# Patient Record
Sex: Male | Born: 1975
Health system: Southern US, Community
[De-identification: ages and names within clinical notes are randomized; demographics above are authoritative.]

---

## 2003-07-06 ENCOUNTER — Emergency Department (HOSPITAL_COMMUNITY): Admission: EM | Admit: 2003-07-06 | Discharge: 2003-07-06 | Payer: Self-pay | Admitting: Emergency Medicine

## 2006-03-12 ENCOUNTER — Encounter: Admission: RE | Admit: 2006-03-12 | Discharge: 2006-03-12 | Payer: Self-pay | Admitting: Neurosurgery

## 2006-04-03 ENCOUNTER — Ambulatory Visit (HOSPITAL_COMMUNITY): Admission: RE | Admit: 2006-04-03 | Discharge: 2006-04-04 | Payer: Self-pay | Admitting: Neurosurgery

## 2006-08-30 IMAGING — CR DG LUMBAR SPINE 1V
1 series · 1 of 1 positions shown · non-contrast
Comparison: Lumbar spine MRI done at [HOSPITAL] on 03/12/06.

CLINICAL DATA: Redo L5-S1 diskectomy.
 PORTABLE LUMBAR SPINE ? 1 VIEW ? 04/03/06:

[view not recorded]
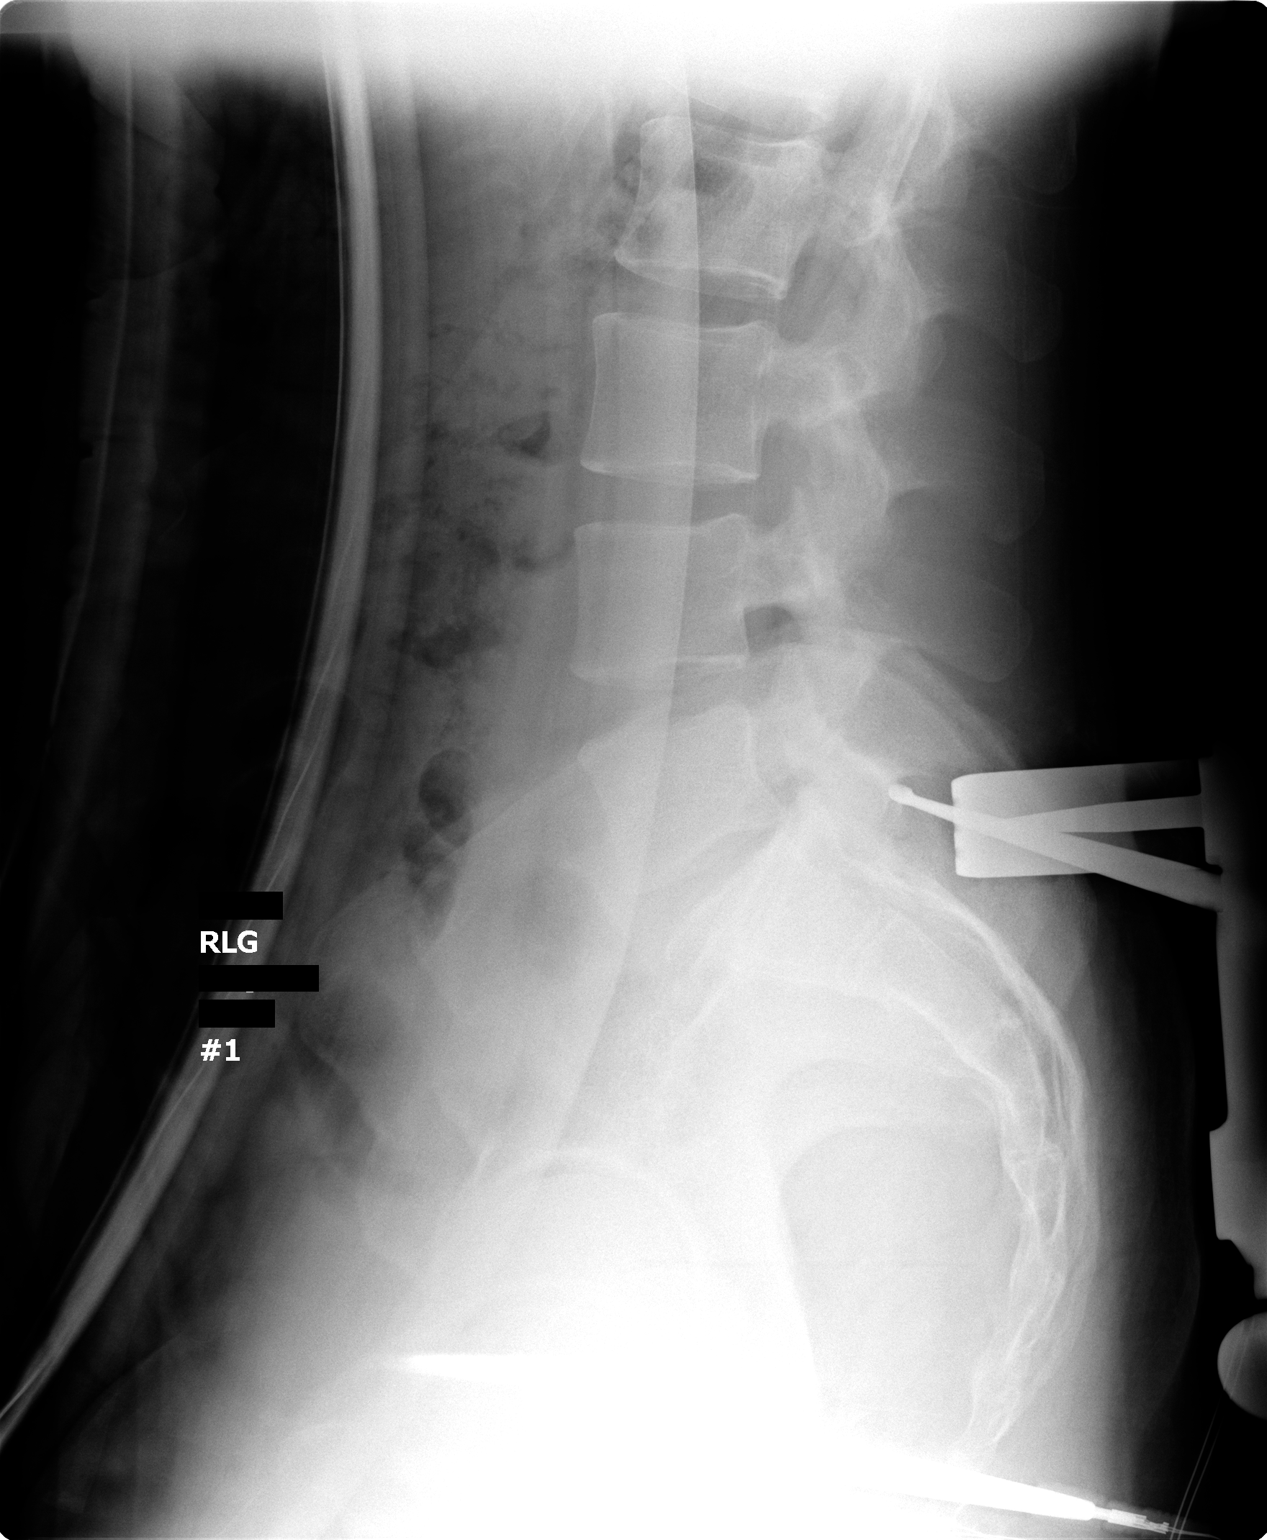

[1 of 1 positions shown; findings below may reference images not displayed]

FINDINGS: Intraoperative lateral view of the lumbar spine demonstrates retractors and a surgical probe posterior to the L5-S1 disk space.
IMPRESSION: Intraoperative localization as described.

## 2012-07-07 ENCOUNTER — Ambulatory Visit (HOSPITAL_BASED_OUTPATIENT_CLINIC_OR_DEPARTMENT_OTHER)
Admission: RE | Admit: 2012-07-07 | Discharge: 2012-07-07 | Disposition: A | Discharge status: Home / Self Care | Payer: Self-pay | Source: Ambulatory Visit | Attending: Occupational Medicine | Admitting: Occupational Medicine

## 2012-07-07 ENCOUNTER — Other Ambulatory Visit: Payer: Self-pay | Admitting: Occupational Medicine

## 2012-07-07 DIAGNOSIS — Z0000 Encounter for general adult medical examination without abnormal findings: Secondary | ICD-10-CM

## 2012-12-03 IMAGING — CR DG CHEST 1V
1 series · 1 of 1 positions shown · non-contrast
Comparison: None.

CLINICAL DATA: Annual physical examination.

CHEST - 1 VIEW

[w chest pa]
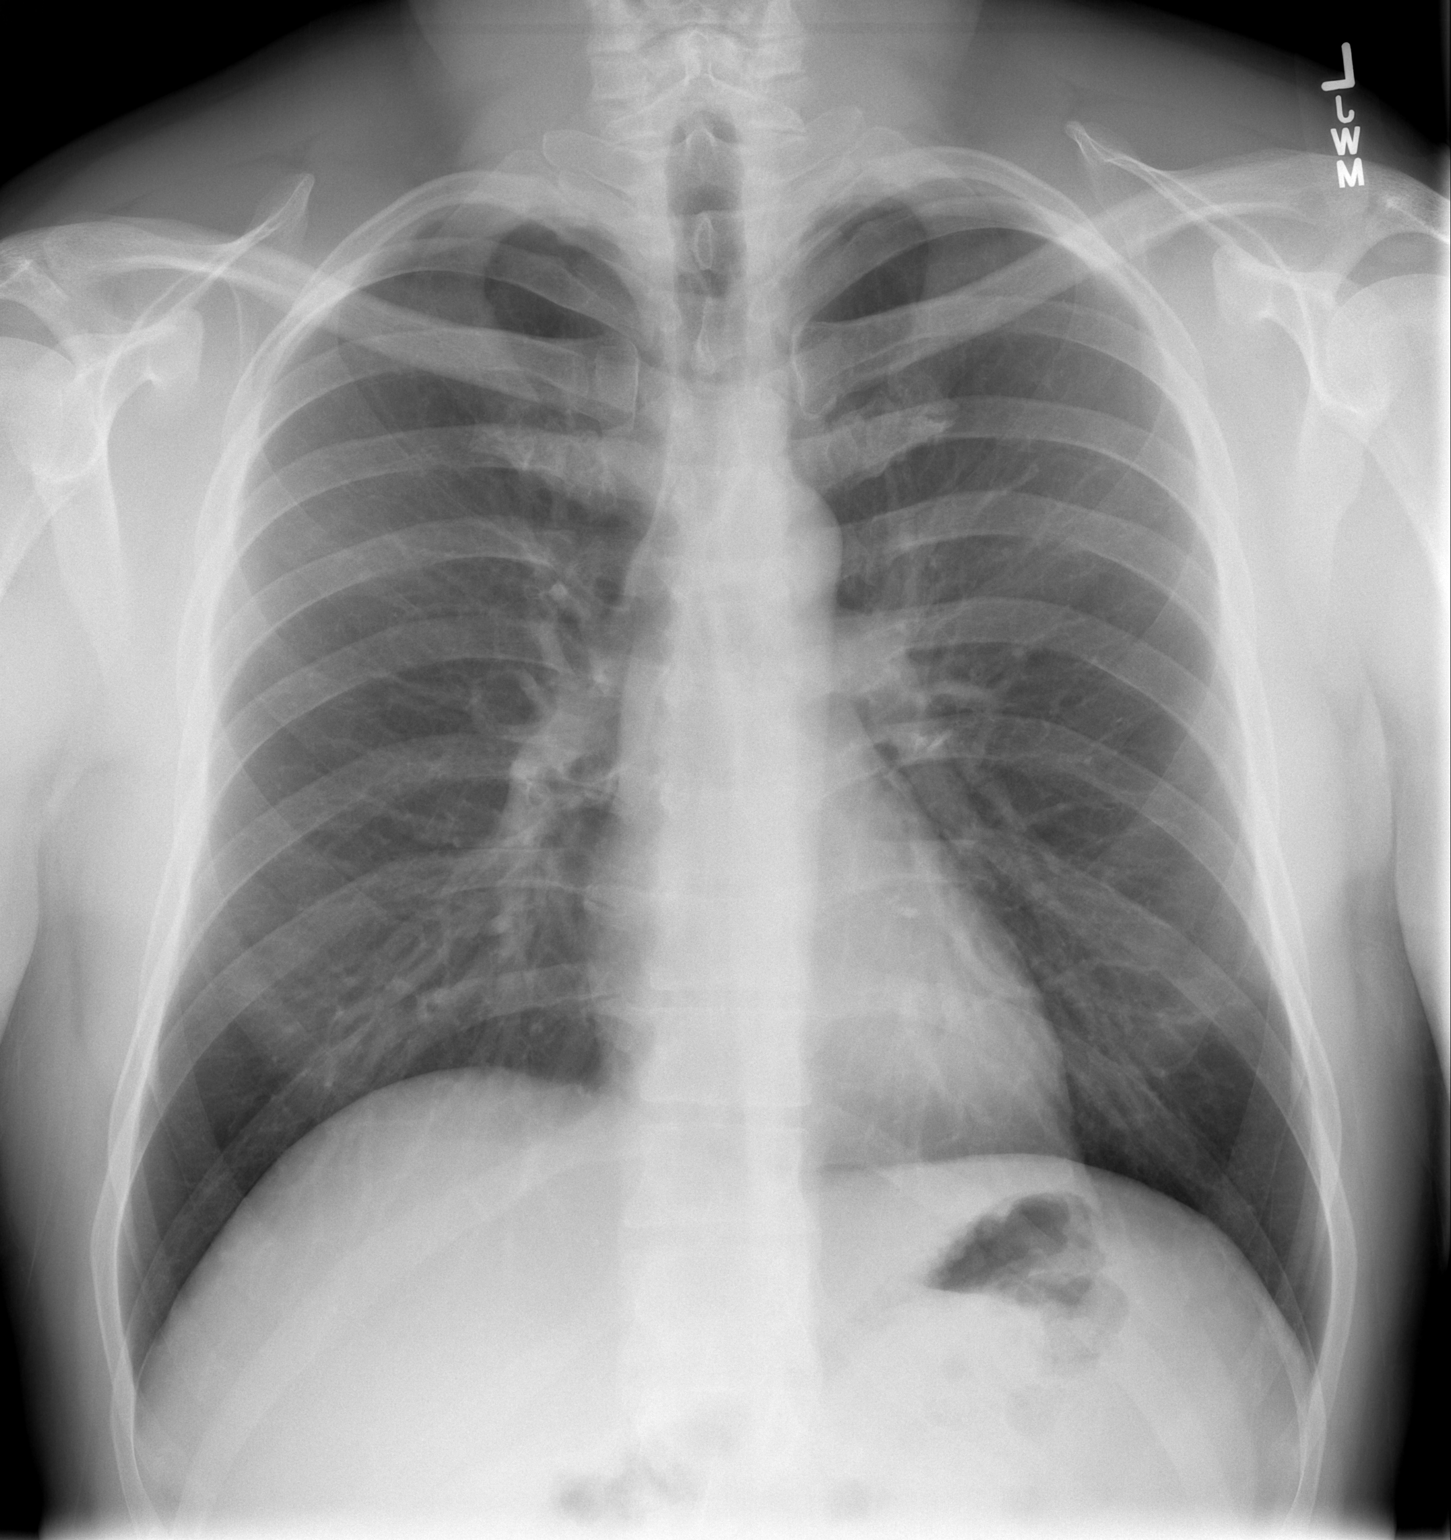

[1 of 1 positions shown; findings below may reference images not displayed]

FINDINGS: The heart size and mediastinal contours are normal. The
lungs are clear. There is no pleural effusion or pneumothorax. No
acute osseous findings are identified.
IMPRESSION: No active cardiopulmonary process.

## 2016-08-13 ENCOUNTER — Ambulatory Visit: Payer: Self-pay | Admitting: Family Medicine

## 2016-12-26 ENCOUNTER — Encounter: Payer: Self-pay | Admitting: Sports Medicine

## 2016-12-26 ENCOUNTER — Ambulatory Visit (INDEPENDENT_AMBULATORY_CARE_PROVIDER_SITE_OTHER): Payer: Managed Care, Other (non HMO) | Admitting: Sports Medicine

## 2016-12-26 VITALS — BP 136/55 | HR 70 | Ht 73.0 in | Wt 200.0 lb

## 2016-12-26 DIAGNOSIS — M79652 Pain in left thigh: Secondary | ICD-10-CM

## 2016-12-26 DIAGNOSIS — M25552 Pain in left hip: Secondary | ICD-10-CM

## 2016-12-26 MED ORDER — GABAPENTIN 300 MG PO CAPS: 300.0000 mg | ORAL_CAPSULE | Freq: Every day | ORAL | 3 refills | Status: DC

## 2016-12-26 MED ORDER — TRAMADOL HCL 50 MG PO TABS: 50.0000 mg | ORAL_TABLET | Freq: Three times a day (TID) | ORAL | 1 refills | Status: DC | PRN

## 2016-12-26 MED FILL — traMADol HCL 50 MG TABS: 50 | 30 days supply | Qty: 90 | Fill #0

## 2016-12-26 MED FILL — GABAPENTIN 300 MG CAPSULE: 300 | 30 days supply | Qty: 30 | Fill #0

## 2016-12-26 NOTE — Progress Notes (Signed)
Subjective:    Alexander Hopkins is a 41 y.o. 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 He had another incident of disc rupture in 2007 and had discectomy at the same level.   Now comes in with left thigh pain posteriorly for almost a year. Describes the pain as dull and stiff in his upper thigh posteriorly. No radiation to his lower leg or foot. Denies numbness or weakness. However, he reports left leg unsteadiness when he does jogging and works of on row machine. He also reports stiffness in that leg at the end of the day when he lies on that side. Denies fever, urinary or bowel issues or saddle paresthesia. He reports trying tramadol in the past with some improvement in his pain. PMH/Problem List: has Left thigh pain on his problem list.   has no past medical history on file.  FH:  No family history on file.  SH Social History  Substance Use Topics  . Smoking status: Never Smoker  . Smokeless tobacco: Never Used  . Alcohol use Not on file    Review of Systems Review of systems negative except for pertinent positives and negatives in history of present illness above.     Objective:     Vitals:   12/26/16 1018  BP: (!) 136/55  Pulse: 70  Weight: 200 lb (90.7 kg)  Height: 6\' 1"  (1.854 m)    Physical Exam GEN: appears well and fit, no apparent distress. MSK:   Hip and Left Lower Extremity No bony deformities, inflammation or skin lesion, or tenderness in hip joint. Normal ROM upon flexion & extension, internal & external rotation, abduction, & adduction. FABER and FADIR normal. Notable asymmetry in his lower calf with walking, with left calf smaller that right. Left leg instability noted with jogging in the hall way. Right calf circumference 38 cm. Left call circumference 36.5 cm at maximum point. Weakness with  abduction with gluteus medius isolated in the left. No significant weakness noted with recruitment of gluteus maximus and iliotibial muscle. Weakness of left hamstring muscle with resisted flexion in his left knee. SKIN: no apparent skin lesion PSYCH: euthymic mood with congruent affect    Assessment and Plan:  Left thigh pain Likely due to nerve inhibition from his prior disc herniations. Exam remarkable for assymmetry in his calf with right calf bigger than left and unsteadiness in his left leg while jogging and walking. Significant weakness with left leg abduction with isolation of gluteus medius. Recommended strengthening exercises as below 1. Left leg mini squat three sets of 15 2. Wall squat (left leg squat with back against the wall) three sets of 15 3. Hip abduction: 3 sets of 30 without weight 4. Lateral step up with 60 lbs of weight on your shoulder 5. Cross over step ups 6. Hamstring exercise with some weight on your ankle (4 sets of 8) 7. Diver exercise  Gave prescription for gabapentin 300 mg at bedtime as needed for pain with 2 refills and tramadol 50 mg 3 times a day as needed for pain with one refill. Patient to return in 3 months or sooner if needed.   Return in about 2 months (around 02/25/2017) for Hamstring pain.  Almon Hercules, MD   I observed and examined the patient with the resident and  agree with assessment and plan.  Note reviewed and modified by me.  Enid BaasKarl Fields, MD  12/26/16

## 2016-12-26 NOTE — Assessment & Plan Note (Addendum)
Likely due to nerve inhibition from his prior disc herniations. Exam remarkable for assymmetry in his calf with right calf bigger than left and unsteadiness in his left leg while jogging and walking. Significant weakness with left leg abduction with isolation of gluteus medius. Recommended strengthening exercises as below 1. Left leg mini squat three sets of 15 2. Wall squat (left leg squat with back against the wall) three sets of 15 3. Hip abduction: 3 sets of 30 without weight 4. Lateral step up with 60 lbs of weight on your shoulder 5. Cross over step ups 6. Hamstring exercise with some weight on your ankle (4 sets of 8) 7. Diver exercise  Gave prescription for gabapentin 300 mg at bedtime as needed for pain with 2 refills and tramadol 50 mg 3 times a day as needed for pain with one refill. Patient to return in 3 months or sooner if needed.

## 2016-12-26 NOTE — Patient Instructions (Addendum)
It is nice seeing you today! Your symptoms are likely due to some muscle weakness in your left leg. We recommned the following exercise:  1. Left leg mini squat three sets of 15 2. Wall squat (left leg squat with back against the wall) three sets of 15 3. Hip abduction: 3 sets of 30 without weight 4. Use the lateral step up with 60 lbs of weight on you shoulder 5. Cross over step up as on the handout we gave you 6. Hamstring exercise with some weight on your ankle (4 sets of 8) 7. Diver exercise  8. Try the medication we gave you at bedtime

## 2017-01-29 MED FILL — traMADol HCL 50 MG TABS: 50 | 30 days supply | Qty: 90 | Fill #1

## 2017-02-25 ENCOUNTER — Other Ambulatory Visit: Payer: Self-pay | Admitting: Student

## 2017-02-25 DIAGNOSIS — M25552 Pain in left hip: Secondary | ICD-10-CM

## 2017-02-27 ENCOUNTER — Other Ambulatory Visit: Payer: Self-pay | Admitting: Student

## 2017-02-27 DIAGNOSIS — M25552 Pain in left hip: Secondary | ICD-10-CM

## 2017-03-05 ENCOUNTER — Ambulatory Visit (INDEPENDENT_AMBULATORY_CARE_PROVIDER_SITE_OTHER): Payer: Managed Care, Other (non HMO) | Admitting: Sports Medicine

## 2017-03-05 ENCOUNTER — Encounter: Payer: Self-pay | Admitting: Sports Medicine

## 2017-03-05 DIAGNOSIS — M79652 Pain in left thigh: Secondary | ICD-10-CM | POA: Diagnosis not present

## 2017-03-05 DIAGNOSIS — M5106 Intervertebral disc disorders with myelopathy, lumbar region: Secondary | ICD-10-CM | POA: Insufficient documentation

## 2017-03-05 MED ORDER — TRAMADOL HCL 50 MG PO TABS: 50.0000 mg | ORAL_TABLET | Freq: Three times a day (TID) | ORAL | 3 refills | Status: DC

## 2017-03-05 MED FILL — traMADol HCL 50 MG TABS: 50 | 30 days supply | Qty: 90 | Fill #0

## 2017-03-05 NOTE — Assessment & Plan Note (Signed)
This was radicular No response to gabapentin but good response to tramadol

## 2017-03-05 NOTE — Assessment & Plan Note (Signed)
Needs to continue back exercises  Cont to work on Hip strength  Use tramadol tid and some prn ibuprofen  RECK 4 mos

## 2017-03-05 NOTE — Progress Notes (Signed)
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 now Doing HEP for hip weakness Much less night pain Left leg feels more stable  Tramadol 50 TID controls pain with minimal radicular sxs and can sleep through night Gabapentin did not seem to help so he stopped this  ROS No numbness in foot or lower leg No cough or sneeze pain No radiating pain below knee  PE Muscular M in NAD BP (!) 134/97   Ht 6\' 1"  (1.854 m)   Wt 200 lb (90.7 kg)   BMI 26.39 kg/m   Left hip shows full ROM SLR is tight at 45 degrees left and 60 RT Hip abduction is now strong (was weak) 1 leg balance improved Hamstring testing on Left strong but causes cramping  Gait is improved with trendelenburg no longer present

## 2017-04-04 MED FILL — traMADol HCL 50 MG TABS: 50 | 30 days supply | Qty: 90 | Fill #1

## 2017-05-02 MED FILL — traMADol HCL 50 MG TABS: 50 | 30 days supply | Qty: 90 | Fill #2

## 2017-06-02 MED FILL — traMADol HCL 50 MG TABS: 50 | 30 days supply | Qty: 90 | Fill #3

## 2017-07-01 ENCOUNTER — Other Ambulatory Visit: Payer: Self-pay | Admitting: Sports Medicine

## 2017-07-02 MED FILL — traMADol HCL 50 MG TABS: 50 | 30 days supply | Qty: 90 | Fill #0

## 2017-07-15 ENCOUNTER — Ambulatory Visit (INDEPENDENT_AMBULATORY_CARE_PROVIDER_SITE_OTHER): Payer: Managed Care, Other (non HMO) | Admitting: Sports Medicine

## 2017-07-15 VITALS — BP 126/82 | Ht 73.0 in | Wt 190.0 lb

## 2017-07-15 DIAGNOSIS — M25562 Pain in left knee: Secondary | ICD-10-CM | POA: Diagnosis not present

## 2017-07-15 DIAGNOSIS — M76892 Other specified enthesopathies of left lower limb, excluding foot: Secondary | ICD-10-CM | POA: Diagnosis not present

## 2017-07-15 NOTE — Progress Notes (Signed)
Chief complaint: Follow-up low back and left hip pain, new complaint of left knee crepitus 2 weeks  History of present illness: Alexander Hopkins is a 41 year old male who presents to the sports medicine office today for follow-up of low back and left hip/thigh radiculopathy. He was last seen here in the office back on 03/05/17. At that time, he was doing strengthening exercises. He has been doing Nurse, mental health, has been doing box squats and lunges. He reports feeling that his hip strength has improved. Of note, he does have history of lumbar radiculopathy affecting the left side, had surgery back in 2007. Had a repeat injury back in 2015. Has had laminectomy. Not taking tramadol or gabapentin for pain. Overall, he is pleased with his progress. As not report of any numbness or tingling, does report a slight weakness and left lower extremity compared to the right lower extremity. Does not report of any anterior lateral hip pain on the left side.  Today, he would like to discuss left knee crepitus. He reports over the last 2 weeks noting left knee crunching. He reports first noticing this when doing a box squats 2 weeks ago. He reports specifically feeling this on the medial aspect of the left knee. He does not report of any warmth, erythema, ecchymosis, or effusion. He reports a very minimal pain, only 2/10. He reports of any pivoting or twisting as aggravating factor. No previous left knee injury. Has not used any medications for relief of symptoms. Once to make sure he doesn't do anything to worsen his symptoms.  Review of systems:  As stated above  Physical exam: Vital signs are reviewed and are documented in the chart Gen.: Alert, oriented, appears stated age, in no apparent distress HEENT: Moist oral mucosa Respiratory: Normal respirations, able to speak in full sentences Cardiac: Regular rate, distal pulses 2+ Integumentary: No rashes on visible skin:  Neurologic: Hip abductor strength improved, just minimally  weaker on the left side compared to right side, would actually categorize both as I/5, otherwise strength 5/5, sensation 2+ in bilateral lower extremities Psych: Normal affect, mood is described as good Musculoskeletal: Inspection of left knee reveals no obvious deformity or muscle atrophy, no warmth, erythema, ecchymosis, or effusion, no tenderness to palpation over patellar tendon, quadriceps tendon, medial joint line, lateral joint line, he does have full active and passive range of motion of the left knee, do hear audible crepitus with flexion and extension of the left knee, Mc[place]ray positive for crepitus, negative for pain, [place]ss[place] positive for crepitus, negative for pain  Limited musculoskeletal ultrasound was performed in the office today. He did ultrasound of the left knee was performed today. He does have slight suprapatellar effusion noted, but no hypoechoic changes seen along quadriceps tendon. He does have spurring noted on the distal quadriceps tendon. Patellar tendon normal, without any evidence of hypoechogenicity or spurring. Medial joint line reviewed, normal MCL, he does have effusion noted along medial meniscus, but no evidence of tearing. Do see spots of calcification. On lateral joint line, has normal popliteus tendon, normal LCL, normal lateral meniscus  Impression: Effusion involving medial meniscus, with no evidence of tearing   Assessment and plan: 1. Left hip pain, hip abductor tendinopathy-resolving 2. Left knee pain/crepitus, suspect effusion involving medial meniscus, no evidence of degenerative tearing, but this is also a possibility  Left hip pain -Discussed continue doing hip strengthening exercises, nothing new to add otherwise  Left knee pain -Discussed evidence of effusion surrounding the left medial meniscus, based on ultrasound  do not see any evidence of tearing, do see a few spots of very tiny calcifications that is most likely causing the crepitus -Discussed  avoidance of box squats or any excessive force and hyperflexion of the left knee -Discussed body Helix knee brace, physical therapy exercises strengthening the quads and hamstrings -Discussed stationary bike for the next few weeks, may do growing as long as it does not apply significant force on the left knee  Will have patient follow-up on as-needed basis.   Haynes Kerns, M.D. Primary Care Sports Medicine Fellow [facility]

## 2017-07-30 MED FILL — traMADol HCL 50 MG TABS: 50 | 30 days supply | Qty: 90 | Fill #1

## 2017-08-27 MED FILL — traMADol HCL 50 MG TABS: 50 | 30 days supply | Qty: 90 | Fill #2

## 2017-09-29 MED FILL — traMADol HCL 50 MG TABS: 50 | 30 days supply | Qty: 90 | Fill #3

## 2017-10-30 ENCOUNTER — Other Ambulatory Visit: Payer: Self-pay | Admitting: *Deleted

## 2017-10-30 ENCOUNTER — Other Ambulatory Visit: Payer: Self-pay | Admitting: Sports Medicine

## 2017-10-30 MED ORDER — TRAMADOL HCL 50 MG PO TABS: 50.0000 mg | ORAL_TABLET | Freq: Three times a day (TID) | ORAL | 1 refills | Status: DC

## 2017-10-30 MED FILL — traMADol HCL 50 MG TABS: 50 | 20 days supply | Qty: 60 | Fill #0

## 2017-11-20 MED FILL — traMADol HCL 50 MG TABS: 50 | 20 days supply | Qty: 60 | Fill #1

## 2017-12-04 ENCOUNTER — Other Ambulatory Visit: Payer: Self-pay

## 2017-12-04 MED ORDER — TRAMADOL HCL 50 MG PO TABS: 50.0000 mg | ORAL_TABLET | Freq: Three times a day (TID) | ORAL | 1 refills | Status: DC

## 2018-02-04 ENCOUNTER — Other Ambulatory Visit: Payer: Self-pay | Admitting: *Deleted

## 2018-02-04 MED ORDER — TRAMADOL HCL 50 MG PO TABS: 50.0000 mg | ORAL_TABLET | Freq: Three times a day (TID) | ORAL | 0 refills | Status: DC

## 2018-03-06 ENCOUNTER — Other Ambulatory Visit: Payer: Self-pay | Admitting: *Deleted

## 2018-03-06 MED ORDER — TRAMADOL HCL 50 MG PO TABS: 50.0000 mg | ORAL_TABLET | Freq: Three times a day (TID) | ORAL | 0 refills | Status: DC

## 2018-04-10 ENCOUNTER — Encounter: Payer: Self-pay | Admitting: Family Medicine

## 2018-04-10 ENCOUNTER — Ambulatory Visit: Payer: Managed Care, Other (non HMO) | Admitting: Family Medicine

## 2018-04-10 DIAGNOSIS — M5106 Intervertebral disc disorders with myelopathy, lumbar region: Secondary | ICD-10-CM

## 2018-04-10 MED ORDER — TRAMADOL HCL 50 MG PO TABS: 50.0000 mg | ORAL_TABLET | Freq: Three times a day (TID) | ORAL | 0 refills | Status: DC

## 2018-04-10 NOTE — Assessment & Plan Note (Signed)
Patient is following up regarding his chronic left-sided low back pain.  No changes since last being seen.  He would like medication refill. -Tramadol refill provided today.

## 2018-04-10 NOTE — Progress Notes (Signed)
   HPI  CC: Follow-up chronic back pain Patient is here to follow-up regarding his chronic left-sided low back pain.  He states that he has been dealing with this for many years.  He has been getting regular refills of Toradol to help control this pain and states that he would like a refill today.  He denies any new injury, trauma, or event.  No change in symptoms.  Medication continues to work as it had been previously.  No bowel or bladder incontinence.  No weakness, numbness, or paresthesias.  Medications/Interventions Tried: Ultram, gabapentin, narcotics, HEP, physical therapy.  See HPI and/or previous note for associated ROS.  Objective: BP (!) 144/92   Ht 6\' 1"  (1.854 m)   Wt 200 lb (90.7 kg)   BMI 26.39 kg/m  Gen: NAD, well groomed, a/o x3, normal affect.  CV: Well-perfused. Warm.  Resp: Non-labored.  Neuro: Sensation intact throughout. No gross coordination deficits.  Gait: Nonpathologic posture, unremarkable stride without signs of limp or balance issues. Back: Inspection reveals no evidence of erythema, ecchymosis, or bony deformity.  Range of motion seemingly intact throughout.  Some tenderness to palpation along the left proximal pelvis within the glutes and near the SI joint.  Negative Faber.  Negative SLR.  Strength 5/5 bilaterally.  DTRs +2 bilaterally.  Neurovascular intact distally.   Assessment and plan:  Intervertebral lumbar disc disorder with myelopathy, lumbar region Patient is following up regarding his chronic left-sided low back pain.  No changes since last being seen.  He would like medication refill. -Tramadol refill provided today.   Meds ordered this encounter  Medications  . traMADol (ULTRAM) 50 MG tablet    Sig: Take 1 tablet (50 mg total) by mouth 3 (three) times daily.    Dispense:  90 tablet    Refill:  0     Kathee DeltonIan D Dorice Stiggers, MD,MS [facility] Sports Medicine Fellow 04/10/2018 5:36 PM

## 2018-04-28 ENCOUNTER — Ambulatory Visit: Payer: Self-pay | Admitting: Sports Medicine

## 2018-05-11 ENCOUNTER — Other Ambulatory Visit: Payer: Self-pay | Admitting: *Deleted

## 2018-05-11 MED ORDER — TRAMADOL HCL 50 MG PO TABS: 50.0000 mg | ORAL_TABLET | Freq: Three times a day (TID) | ORAL | 0 refills | Status: DC

## 2018-05-14 ENCOUNTER — Ambulatory Visit: Payer: Self-pay | Admitting: Adult Health

## 2018-05-14 NOTE — Progress Notes (Deleted)
   Subjective:    Patient ID: Hal HopeChristopher K Hopwood, male    DOB: 09/25/1976, 42 y.o.   MRN: [id number]  HPI:  Ms. Robby SermonHendricks is here to establish as a new pt.  She is a very pleasant 42 year old male. PMH: Intervertebral lumbar disc disorder with myelopathy, lumbar region  Patient Care Team    Relationship Specialty Notifications Start End  William Hamburgeranford, Irasema Chalk D, NP PCP - General Family Medicine  04/08/18   Kaleen MaskElkins, Wilson Oliver, MD Referring Physician Family Medicine  04/08/18     Patient Active Problem List   Diagnosis Date Noted  . Intervertebral lumbar disc disorder with myelopathy, lumbar region 03/05/2017  . Left thigh pain 12/26/2016     No past medical history on file.   No past surgical history on file.   No family history on file.   Social History   Substance and Sexual Activity  Drug Use Not on file     Social History   Substance and Sexual Activity  Alcohol Use Not on file     Social History   Tobacco Use  Smoking Status Never Smoker  Smokeless Tobacco Never Used     Outpatient Encounter Medications as of 05/14/2018  Medication Sig  . traMADol (ULTRAM) 50 MG tablet Take 1 tablet (50 mg total) by mouth 3 (three) times daily.   No facility-administered encounter medications on file as of 05/14/2018.     Allergies: Patient has no known allergies.  There is no height or weight on file to calculate BMI.  There were no vitals taken for this visit.     Review of Systems     Objective:   Physical Exam        Assessment & Plan:  No diagnosis found.  No problem-specific Assessment & Plan notes found for this encounter.    FOLLOW-UP:  No follow-ups on file.

## 2018-06-11 ENCOUNTER — Other Ambulatory Visit: Payer: Self-pay | Admitting: Sports Medicine

## 2018-06-11 NOTE — Telephone Encounter (Signed)
Refill was faxed to pt's pharmacy this morning.

## 2018-06-11 NOTE — Telephone Encounter (Signed)
[facility] to [facility] to [facility] [address].  Please contact patient with any questions, thanks.  [phone]

## 2018-07-02 ENCOUNTER — Other Ambulatory Visit: Payer: Self-pay | Admitting: *Deleted

## 2018-07-02 MED ORDER — TRAMADOL HCL 50 MG PO TABS: 50.0000 mg | ORAL_TABLET | Freq: Two times a day (BID) | ORAL | 0 refills | Status: DC | PRN

## 2018-08-13 ENCOUNTER — Other Ambulatory Visit: Payer: Self-pay | Admitting: Sports Medicine

## 2018-08-13 MED ORDER — TRAMADOL HCL 50 MG PO TABS: 50.0000 mg | ORAL_TABLET | Freq: Two times a day (BID) | ORAL | 0 refills | Status: DC | PRN

## 2018-09-22 ENCOUNTER — Other Ambulatory Visit: Payer: Self-pay | Admitting: Sports Medicine

## 2018-09-29 ENCOUNTER — Other Ambulatory Visit: Payer: Self-pay | Admitting: *Deleted

## 2018-09-29 MED ORDER — TRAMADOL HCL 50 MG PO TABS: ORAL_TABLET | ORAL | 0 refills | Status: DC

## 2018-10-29 ENCOUNTER — Other Ambulatory Visit: Payer: Self-pay | Admitting: Sports Medicine

## 2018-11-02 ENCOUNTER — Other Ambulatory Visit: Payer: Self-pay | Admitting: *Deleted

## 2018-11-02 MED ORDER — TRAMADOL HCL 50 MG PO TABS: ORAL_TABLET | ORAL | 0 refills | Status: DC

## 2018-11-03 ENCOUNTER — Telehealth: Payer: Self-pay | Admitting: *Deleted

## 2018-11-03 NOTE — Telephone Encounter (Signed)
Called [facility] and Tramadol is on backorder. Will fax Rx to [facility] at [phone]

## 2018-11-24 ENCOUNTER — Ambulatory Visit (INDEPENDENT_AMBULATORY_CARE_PROVIDER_SITE_OTHER): Payer: Managed Care, Other (non HMO) | Admitting: Sports Medicine

## 2018-11-24 DIAGNOSIS — M5106 Intervertebral disc disorders with myelopathy, lumbar region: Secondary | ICD-10-CM

## 2018-11-24 MED ORDER — METHOCARBAMOL 500 MG PO TABS: 500.0000 mg | ORAL_TABLET | Freq: Four times a day (QID) | ORAL | 2 refills | Status: DC | PRN

## 2018-11-24 NOTE — Progress Notes (Signed)
   HPI  CC: Low back pain  Alexander Hopkins is a 43 year old male who presents for follow-up of chronic low back pain.  He has had some left-sided radicular symptoms since he had a laminectomy back in 2001.  He states the numbness and tingling has been better since that procedure, but now he gets some spasms from time to time in his left leg.  He states he has been doing a Nurse, mental health style workout with high intensity exercises for the past 6 months.  He states that he will get some fasciculations when he gets several reps into his workout on the left side only.  Otherwise, he states his pain is been pretty well controlled on the tramadol.  Note when he tried to wean to bid tramadol muscle spasms in quads awakened him at night  Past Hx; urethral stricture in childhood  Soc. HX: works as IT sales professional  See HPI and/or previous note for associated ROS.  Objective: BP (!) 130/92   Ht 6\' 1"  (1.854 m)   Wt 200 lb (90.7 kg)   BMI 26.39 kg/m  Gen: Right-Hand Dominant. NAD, well groomed, a/o x3, normal affect.  CV: Well-perfused. Warm.  Resp: Non-labored.  Neuro: Sensation intact throughout. No gross coordination deficits.  Gait: Nonpathologic posture, unremarkable stride without signs of limp or balance issues.  Back exam: No erythema, warmth, swelling noted.  No tenderness palpation noted on exam.  Full range of motion of the spine in forward flexion, extension, side to side motion.  Strength 5 out of 5 throughout all lower extremity testing.  Negative logroll of the left side.  Positive FABER testing.  Negative FADIR.  Negative straight leg raise bilaterally. Note - mild atrophy in left thigh compared to RT  Assessment and plan: Intervertebral lumbar disc disorder, with associated myelopathy.  He is now experiencing some spasms post work-out and at night  We discussed treatment options at today's visit.  We will refill his tramadol at this time.  We will keep the dosing at 3 times daily.  We will give him  Robaxin as needed for muscle spasms following workouts.  We will see him back for follow-up and 4 months.  We did discuss possibility of starting TCA today for neurologic pain.  He has a history of urethral stenosis as a child which would be a relative contraindication to start his medication.  Alexander Quan, MD [facility] Sports Medicine Fellow 11/24/2018 1:11 PM  I observed and examined the patient with the [facility] Fellow and agree with assessment and plan.  Note reviewed and modified by me. Alexander Big, MD

## 2018-11-24 NOTE — Assessment & Plan Note (Signed)
Keep up workouts but modify when causing fasiculations  Tramadol tid indefinitely as it controls his spasms  See how robaxin works for [employer] and soreness

## 2018-12-03 ENCOUNTER — Other Ambulatory Visit: Payer: Self-pay | Admitting: *Deleted

## 2018-12-03 MED ORDER — TRAMADOL HCL 50 MG PO TABS: ORAL_TABLET | ORAL | 0 refills | Status: DC

## 2018-12-31 ENCOUNTER — Other Ambulatory Visit: Payer: Self-pay | Admitting: *Deleted

## 2018-12-31 MED ORDER — TRAMADOL HCL 50 MG PO TABS: ORAL_TABLET | ORAL | 0 refills | Status: DC

## 2019-01-25 ENCOUNTER — Telehealth: Payer: Self-pay

## 2019-01-25 MED ORDER — TRAMADOL HCL 50 MG PO TABS: ORAL_TABLET | ORAL | 0 refills | Status: DC

## 2019-01-25 NOTE — Telephone Encounter (Signed)
Refill sent to [facility]. Pt informed that the refill date is not until the 6th of each month.

## 2019-03-02 ENCOUNTER — Other Ambulatory Visit: Payer: Self-pay | Admitting: Sports Medicine

## 2019-03-30 ENCOUNTER — Other Ambulatory Visit: Payer: Self-pay | Admitting: Sports Medicine

## 2019-04-28 ENCOUNTER — Other Ambulatory Visit: Payer: Self-pay | Admitting: Sports Medicine

## 2019-05-28 ENCOUNTER — Other Ambulatory Visit: Payer: Self-pay | Admitting: *Deleted

## 2019-05-28 MED ORDER — TRAMADOL HCL 50 MG PO TABS: 50.0000 mg | ORAL_TABLET | Freq: Three times a day (TID) | ORAL | 0 refills | Status: DC | PRN

## 2019-06-28 ENCOUNTER — Other Ambulatory Visit: Payer: Self-pay

## 2019-06-28 MED ORDER — TRAMADOL HCL 50 MG PO TABS: 50.0000 mg | ORAL_TABLET | Freq: Three times a day (TID) | ORAL | 0 refills | Status: DC | PRN

## 2019-07-27 ENCOUNTER — Other Ambulatory Visit: Payer: Self-pay | Admitting: Sports Medicine

## 2019-07-27 MED ORDER — TRAMADOL HCL 50 MG PO TABS: 50.0000 mg | ORAL_TABLET | Freq: Three times a day (TID) | ORAL | 0 refills | Status: DC | PRN

## 2019-08-26 ENCOUNTER — Other Ambulatory Visit: Payer: Self-pay | Admitting: Sports Medicine

## 2019-08-26 MED ORDER — TRAMADOL HCL 50 MG PO TABS: 50.0000 mg | ORAL_TABLET | Freq: Three times a day (TID) | ORAL | 1 refills | Status: DC | PRN

## 2019-08-27 ENCOUNTER — Other Ambulatory Visit: Payer: Self-pay | Admitting: Sports Medicine

## 2019-08-30 ENCOUNTER — Telehealth: Payer: Self-pay

## 2019-08-30 MED ORDER — TRAMADOL HCL 50 MG PO TABS: 50.0000 mg | ORAL_TABLET | Freq: Three times a day (TID) | ORAL | 1 refills | Status: DC | PRN

## 2019-08-30 NOTE — Telephone Encounter (Signed)
Refill faxed to pharmacy.

## 2019-10-27 ENCOUNTER — Other Ambulatory Visit: Payer: Self-pay | Admitting: Sports Medicine

## 2019-11-02 ENCOUNTER — Other Ambulatory Visit: Payer: Self-pay | Admitting: Sports Medicine

## 2019-11-02 MED ORDER — TRAMADOL HCL 50 MG PO TABS: 50.0000 mg | ORAL_TABLET | Freq: Three times a day (TID) | ORAL | 1 refills | Status: DC | PRN

## 2019-11-09 ENCOUNTER — Telehealth (INDEPENDENT_AMBULATORY_CARE_PROVIDER_SITE_OTHER): Payer: Managed Care, Other (non HMO) | Admitting: Sports Medicine

## 2019-11-09 ENCOUNTER — Other Ambulatory Visit: Payer: Self-pay

## 2019-11-09 DIAGNOSIS — M5106 Intervertebral disc disorders with myelopathy, lumbar region: Secondary | ICD-10-CM

## 2019-11-09 NOTE — Progress Notes (Signed)
Patient for my chart video visit.  Good audio and video. Patient at farm in [place]/ doctor at [facility] office  CC. 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 On days with lifting - particularly fallen patients or heavy equipment often has pain worse to left leg If he does too many squats in workouts he gets nighttime LBP and more pain shooting down back of left leg Stops at calf Gets fasiculations on left lateral leg  Since we started tramadol up to TID Robaxin on bad days he has been able to keep work schedule Some days he takes on or 2 tramadol On bad days takes one every 4 hours Adds ibuprofen and that will get him to where he sleeps through the night  ROS No bowel or bladder sxs Weakness noted with weight lifting on left leg but does not bother him with ADLs Able to climb ladders without instability of left leg  Video Exam NAD, Pleasant and [employer] Good motion of lumbar spine Feel tightness lateral leg standing on left leg

## 2019-11-09 NOTE — Assessment & Plan Note (Addendum)
He has been more stable using avg. 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 Over 50% of visit was spend in counseling and coordination of care for problems with lumbar radiculopathy.

## 2019-11-10 ENCOUNTER — Other Ambulatory Visit: Payer: Self-pay | Admitting: *Deleted

## 2019-11-10 MED ORDER — TRAMADOL HCL 50 MG PO TABS: 50.0000 mg | ORAL_TABLET | Freq: Three times a day (TID) | ORAL | 4 refills | Status: DC | PRN

## 2019-11-10 MED ORDER — METHOCARBAMOL 500 MG PO TABS: 500.0000 mg | ORAL_TABLET | Freq: Four times a day (QID) | ORAL | 4 refills | Status: DC | PRN

## 2019-11-11 ENCOUNTER — Telehealth: Payer: Managed Care, Other (non HMO) | Admitting: Sports Medicine

## 2020-06-01 ENCOUNTER — Other Ambulatory Visit: Payer: Self-pay | Admitting: *Deleted

## 2020-06-01 MED ORDER — TRAMADOL HCL 50 MG PO TABS: 50.0000 mg | ORAL_TABLET | Freq: Three times a day (TID) | ORAL | 4 refills | Status: DC | PRN

## 2020-06-29 ENCOUNTER — Telehealth (INDEPENDENT_AMBULATORY_CARE_PROVIDER_SITE_OTHER): Payer: Managed Care, Other (non HMO) | Admitting: Sports Medicine

## 2020-06-29 DIAGNOSIS — M5106 Intervertebral disc disorders with myelopathy, lumbar region: Secondary | ICD-10-CM | POA: Diagnosis not present

## 2020-06-29 NOTE — Progress Notes (Signed)
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[employer]- laminectomy in 2001 2007 ruptured and had discectomy  I started working with him in 2018 Radiating pain into his left hip and leg weakness at the time improved with exercise We tried gabapentin but he had sideeffects However, with tramadol tid he no longer gets night pain and can do physical work without severe hamstring and thigh pain  No pain and fasiculations in his leg return only if he does heavy squats Does too much exercise with hip flexion Climbs ladders He is able to run steps even with weighted back pack  Exam - I noted ability to stand on 1 leg Location of pain to upper post HS more laterally with deep bend

## 2020-06-29 NOTE — Assessment & Plan Note (Signed)
He is stable with this Does get a return of significant radicular sxs if he skips tramadol or does too much of certain exercises or has to climb ladders  Not using robaxin except rarely with leg fasiculates and pain is high Tramadol tid with no side-effects is doing a good job - when more painful he adds ibuprofen with the tramdol dose and that helps  Cont. This and recheck q 6 mos.

## 2020-10-25 ENCOUNTER — Telehealth: Payer: Self-pay | Admitting: Sports Medicine

## 2020-10-25 NOTE — Telephone Encounter (Signed)
Patient called states was told to call for refill when he was just about out of Rx  :  traMADol (ULTRAM) 50 MG tablet [[id number]]   Order Details Dose: 50 mg Route: Oral Frequency: Every 8 hours PRN  Dispense Quantity: 90 tablet Refills: 4       Sig: Take 1 tablet (50 mg total) by mouth every 8 (eight) hours as needed. for pain   Pt uses:   [facility] - [place], [place] - [address] Phone:  [phone]  Fax:  [phone]     --  Pt was advised office clsd till 10/30/20 & to allow time.   ---- Forwarding request.  -glh

## 2020-10-26 ENCOUNTER — Other Ambulatory Visit: Payer: Self-pay | Admitting: Family Medicine

## 2020-10-26 MED ORDER — TRAMADOL HCL 50 MG PO TABS: 50.0000 mg | ORAL_TABLET | Freq: Three times a day (TID) | ORAL | 0 refills | Status: DC | PRN

## 2020-10-26 NOTE — Progress Notes (Signed)
Refilled tramadol.   Myra Rude, MD [facility] 10/26/2020, 1:25 PM

## 2020-11-27 ENCOUNTER — Telehealth: Payer: Self-pay | Admitting: Sports Medicine

## 2020-11-27 NOTE — Telephone Encounter (Signed)
Patient called states his refill in Dec 2021 was for only 30dys & he is just about out (takes 3 pills daily)--  ---Pt says will gladly do a OV or Virtual Appt pls let him know what is required ) for adequate refill of :  tramadol (ULTRAM) 50 MG tablet [[id number]] DISCONTINUED  Order Details Dose: 50 mg Route: Oral Frequency: Every 8 hours PRN  Dispense Quantity: 90 tablet Refills: 4       Sig: Take 1 tablet (50 mg total) by mouth every 8 (eight) hours as needed. for pain       --pt uses :   [facility] - [place], [place] - [address] Phone:  [phone]  Fax:  [phone]     --glh

## 2020-11-28 ENCOUNTER — Other Ambulatory Visit: Payer: Self-pay | Admitting: Sports Medicine

## 2020-11-28 MED ORDER — TRAMADOL HCL 50 MG PO TABS: 50.0000 mg | ORAL_TABLET | Freq: Three times a day (TID) | ORAL | 3 refills | Status: DC | PRN

## 2021-03-22 MED ORDER — TRAMADOL HCL 50 MG PO TABS: 50.0000 mg | ORAL_TABLET | Freq: Three times a day (TID) | ORAL | 3 refills | Status: DC | PRN

## 2021-03-22 NOTE — Progress Notes (Signed)
New refill for his tramadol  Stable dosing.  Sterling Big, MD

## 2021-03-22 NOTE — Addendum Note (Signed)
Addended by: Enid Baas on: 03/22/2021 05:02 PM   Modules accepted: Orders

## 2021-07-20 ENCOUNTER — Other Ambulatory Visit: Payer: Self-pay

## 2021-07-20 MED ORDER — TRAMADOL HCL 50 MG PO TABS: 50.0000 mg | ORAL_TABLET | Freq: Three times a day (TID) | ORAL | 1 refills | Status: DC | PRN

## 2021-07-20 NOTE — Progress Notes (Signed)
Pt called requesting tramadol refill. He is aware he needs to schedule a visit for med check as it has been over a year since he met with Dr. Oneida Alar.

## 2021-09-25 ENCOUNTER — Other Ambulatory Visit: Payer: Self-pay | Admitting: Sports Medicine

## 2021-09-25 MED ORDER — TRAMADOL HCL 50 MG PO TABS: 50.0000 mg | ORAL_TABLET | Freq: Four times a day (QID) | ORAL | 3 refills | Status: DC | PRN

## 2021-10-09 ENCOUNTER — Telehealth (INDEPENDENT_AMBULATORY_CARE_PROVIDER_SITE_OTHER): Payer: Managed Care, Other (non HMO) | Admitting: Sports Medicine

## 2021-10-09 DIAGNOSIS — M5106 Intervertebral disc disorders with myelopathy, lumbar region: Secondary | ICD-10-CM

## 2021-10-09 DIAGNOSIS — M25562 Pain in left knee: Secondary | ICD-10-CM | POA: Diagnosis not present

## 2021-10-09 MED ORDER — TRAMADOL HCL 50 MG PO TABS: 50.0000 mg | ORAL_TABLET | Freq: Four times a day (QID) | ORAL | 3 refills | Status: DC | PRN

## 2021-10-09 NOTE — Assessment & Plan Note (Signed)
I think he is stable on the exercise regimen that he follows We will continue tramadol up to 50 mg 3 times daily to help him resolve radicular pain He also has some Robaxin but finds it only minimally helpful and so rarely uses it  We will reassess this every 6 months to a year

## 2021-10-09 NOTE — Progress Notes (Signed)
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 He agreed to a video visit He understands limitations of video assessment Patient in his car and physician in office at sports medicine Video issues arose in the visit and it was converted to telephone communication  Patient's lumbar radiculopathy continues in good control He usually takes only one half of a 50 mg tramadol and will use this 2-3 times a day He is a IT sales professional and when he does a lot of physical activity and has more pain he may use 50 mg 3 times a day He has been consistent on this dose and has not escalated In fact his total dose appears to me to be less than in the past He continues strengthening exercises and this is helping resolve any weakness  In 2018 I saw him for what appeared to be some degenerative meniscal damage on the medial left knee Compression sleeve and home exercise has generally resolved this He has not been getting infusions He has had 3-4 flares in the last year but they usually lasted no more than 2 days When he does get a flare he thinks he gets some knee swelling   Visit time was 22 minutes

## 2021-10-09 NOTE — Assessment & Plan Note (Signed)
The knee continues pretty stable Use compression sleeve as needed Keep up the quadriceps exercises but avoid steps and too deep of the knee bend If he gets swelling return to the clinic so we can reevaluate

## 2022-03-07 ENCOUNTER — Other Ambulatory Visit: Payer: Self-pay | Admitting: Sports Medicine

## 2022-03-07 MED ORDER — TRAMADOL HCL 50 MG PO TABS: 50.0000 mg | ORAL_TABLET | Freq: Four times a day (QID) | ORAL | 3 refills | Status: DC | PRN

## 2022-07-19 ENCOUNTER — Other Ambulatory Visit: Payer: Self-pay | Admitting: Sports Medicine

## 2022-07-23 ENCOUNTER — Ambulatory Visit (INDEPENDENT_AMBULATORY_CARE_PROVIDER_SITE_OTHER): Payer: Managed Care, Other (non HMO) | Admitting: Sports Medicine

## 2022-07-23 ENCOUNTER — Ambulatory Visit
Admission: RE | Admit: 2022-07-23 | Discharge: 2022-07-23 | Disposition: A | Discharge status: Home / Self Care | Payer: Managed Care, Other (non HMO) | Source: Ambulatory Visit | Attending: Sports Medicine | Admitting: Sports Medicine

## 2022-07-23 VITALS — BP 120/86 | Ht 73.0 in

## 2022-07-23 DIAGNOSIS — M5106 Intervertebral disc disorders with myelopathy, lumbar region: Secondary | ICD-10-CM | POA: Diagnosis not present

## 2022-07-23 MED ORDER — CYCLOBENZAPRINE HCL 10 MG PO TABS: 10.0000 mg | ORAL_TABLET | Freq: Every evening | ORAL | 3 refills | Status: DC | PRN

## 2022-07-23 NOTE — Assessment & Plan Note (Signed)
He currently is functioning quite well but I think his exercise regimen may at sometimes be worsening his symptoms I modified his exercises askling hamstring series For exercises to focus on left hip abduction strengthening  Trial with Flexeril 10 mg at night as needed for spasm Continue tramadol 50 mg 3 times daily which has been enough to keep him able to do the heavy physical work  He will check with me in a couple months on strength and I will continue to evaluate him every 6 to 12 months

## 2022-07-23 NOTE — Progress Notes (Signed)
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 In 1997 while in the [employer] he first hurt his back He had a partial laminectomy Not long after that he had a herniated disc  I have been following him for more than 10 years with treatments aimed at keeping him physically active and able to do his job  Currently he feels like he gets intermittent low back pain and usually with activity that is too heavy He gets radiating symptoms particularly down into his hamstring He gets frequent hamstring cramping If he gets too many symptoms from his activities in the day it would bother him at night and keep him from sleeping  He does not feel significant loss of strength but can tell the left is somewhat weaker than the right  Review of systems reveals no loss of bowel or bladder function No significant numbness  Physical exam Muscular white male in no acute distress BP 120/86   Ht 6\' 1"  (1.854 m)   BMI 26.39 kg/m   Straight leg raise on the right to 60 degrees causes left low back pain Straight leg raise on the left to 60 degrees causes radicular symptoms into the left hamstring Hip range of motion is full bilaterally FABER is normal bilaterally Significant weakness in the left hip abductor The right hip abductor is 5/5 Resisted hamstring testing with knee flexion on the left creates cramping and spasm.  He is not noticeably weak Right hamstring is strong  Lumbosacral spine films Significant disc space loss at L5-S1 Foraminal narrowing Postsurgical change Probable facet joint arthritis  Formal reading by radiologist is pending

## 2022-07-25 ENCOUNTER — Other Ambulatory Visit: Payer: Self-pay | Admitting: Sports Medicine

## 2022-07-25 MED ORDER — TRAMADOL HCL 50 MG PO TABS: 50.0000 mg | ORAL_TABLET | Freq: Four times a day (QID) | ORAL | 3 refills | Status: DC | PRN

## 2022-12-21 ENCOUNTER — Other Ambulatory Visit: Payer: Self-pay | Admitting: Sports Medicine

## 2022-12-26 ENCOUNTER — Other Ambulatory Visit: Payer: Self-pay | Admitting: Sports Medicine

## 2022-12-26 MED ORDER — TRAMADOL HCL 50 MG PO TABS: 50.0000 mg | ORAL_TABLET | Freq: Four times a day (QID) | ORAL | 3 refills | Status: DC | PRN

## 2023-01-07 ENCOUNTER — Ambulatory Visit: Payer: Managed Care, Other (non HMO) | Admitting: Sports Medicine

## 2023-01-09 ENCOUNTER — Other Ambulatory Visit: Payer: Self-pay

## 2023-01-09 ENCOUNTER — Ambulatory Visit: Payer: Managed Care, Other (non HMO) | Admitting: Sports Medicine

## 2023-01-09 VITALS — BP 132/88 | Ht 73.0 in | Wt 200.0 lb

## 2023-01-09 DIAGNOSIS — M10011 Idiopathic gout, right shoulder: Secondary | ICD-10-CM

## 2023-01-09 DIAGNOSIS — M25511 Pain in right shoulder: Secondary | ICD-10-CM | POA: Diagnosis not present

## 2023-01-09 DIAGNOSIS — M79671 Pain in right foot: Secondary | ICD-10-CM | POA: Diagnosis not present

## 2023-01-09 DIAGNOSIS — M109 Gout, unspecified: Secondary | ICD-10-CM | POA: Insufficient documentation

## 2023-01-09 NOTE — Progress Notes (Signed)
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 Rotator cuff strength normal throughout. Pain and some weakness impingement with negative Neer and Hawkin's tests, empty can. Speeds and Yergason's tests normal. No labral pathology noted with negative Obrien's, negative clunk and good stability. Normal scapular function observed. No painful arc and no drop arm sign. No apprehension sign  On the posterior right heel the Achilles tendon feels normal and is nontender There is a significant bump that feels bony just to the lateral aspect of the insertion of the Achilles tendon  Ultrasound of right shoulder  Effusion over the right [facility] joint with some hyperechoic probable crystals within the hypoechoic swelling The AC joint does not look arthritic Rotator cuff scanning is unremarkable except for some hyperechoic crystals or calcifications in the distal supraspinatus tendon Glenohumeral joint looks normal Posterior labrum looks normal  Ultrasound of right heel reveals a bony Haglund's deformity but a normal  Achilles tendon  Impression AC joint effusion probably related to gout and a Haglund's deformity of the right heel  Ultrasound and interpretation by Wolfgang Phoenix. Oneida Alar, MD

## 2023-01-09 NOTE — Assessment & Plan Note (Signed)
I think with the significant swelling and symptoms he has had over his right [facility] joint he should continue colchicine 0.6 twice daily for 1 month and then gradually wean down from this  His primary care physician already has him on allopurinol and will monitor his uric acid and ongoing gout issues  I did suggest modifying his workouts until this settles down  He was reassured about the Haglund's deformity but may want to use some heel lifts to take pressure off the Achilles area

## 2023-01-21 ENCOUNTER — Other Ambulatory Visit: Payer: Self-pay

## 2023-01-21 MED ORDER — COLCHICINE 0.6 MG PO TABS: 0.6000 mg | ORAL_TABLET | Freq: Two times a day (BID) | ORAL | 3 refills | Status: DC

## 2023-02-10 ENCOUNTER — Encounter: Payer: Self-pay | Admitting: *Deleted

## 2023-02-18 ENCOUNTER — Other Ambulatory Visit: Payer: Self-pay

## 2023-02-18 ENCOUNTER — Ambulatory Visit: Payer: Managed Care, Other (non HMO) | Admitting: Sports Medicine

## 2023-02-18 VITALS — BP 132/88 | Ht 73.0 in | Wt 200.0 lb

## 2023-02-18 DIAGNOSIS — M25511 Pain in right shoulder: Secondary | ICD-10-CM

## 2023-02-18 DIAGNOSIS — M10011 Idiopathic gout, right shoulder: Secondary | ICD-10-CM | POA: Diagnosis not present

## 2023-02-18 NOTE — Progress Notes (Signed)
   Established Patient Office Visit  Subjective   Patient ID: Alexander Hopkins, male    DOB: 1976-05-11  Age: 47 y.o. MRN: [id number]  Follow-up right shoulder, and gout flare  Alexander Hopkins is here today with his daughter for follow-up of right shoulder gout flare.  At his last visit approximately 5 weeks ago he appeared to have some hyperechoic crystallization around his [facility] joint.  He was started on colchicine 0.6 mg twice daily.  He has been fairly compliant with this regimen and reports he has had quite a bit of improvement of his symptoms but not yet complete resolution.  He works out regularly and notes he has some difficulty still when he engages in [employer].  He denies any new injuries to his shoulder.  He was also started on allopurinol by his primary care provider.   ROS as listed above in HPI    Objective:     BP 132/88   Ht  (1.854 m)   Wt 200 lb (90.7 kg)   BMI 26.39 kg/m   Physical Exam Vitals reviewed.  Constitutional:      General: He is not in acute distress.    Appearance: Normal appearance. He is not ill-appearing, toxic-appearing or diaphoretic.  Pulmonary:     Effort: Pulmonary effort is normal.  Skin:    General: Skin is warm and dry.     Findings: No bruising or erythema.  Neurological:     Mental Status: He is alert.   Right shoulder: No obvious deformity or asymmetry.  Very slight tenderness to palpation over the [facility] joint.  No ecchymosis erythema or warmth.  He has full range of motion right shoulder with forward flexion and abduction.  Negative empty can testing, negative cross body test.  Grip strength 5/5.  Limited ultrasound: Right AC joint.  There is some bulging of the joint with small hyperechoic changes, suspicious for gouty crystals. Supraspinatus tendon was visualized, there were again some very small hyperechoic changes within the tendon at the anterior portion suspicious for gouty crystals. Impression:  gouty crystals deposited  within the [facility] joint and supraspinatus tendon, improved from previous imaging  Ultrasound and interpretation by Dr. Leonor Hopkins and Alexander Hopkins. Alexander Penna, MD Alexander Big, MD   Assessment & Plan:   Problem List Items Addressed This Visit       Other   Gout attack    He has had significant improvement with colchicine 0.6 mg twice daily.  Will send a refill for him so that he may continue this regimen for a full 3 months.  Recommend continued dietary foods low in purines.  Follow-up in 2 months, if he has had resolution of his symptoms completely would recommend reducing colchicine to once a day.      Other Visit Diagnoses     Right shoulder pain, unspecified chronicity    -  Primary   Relevant Orders   [place] COMPLETE JOINT SPACE STRUCTURES UP RIGHT       Return in about 2 months (around 04/20/2023).    Alexander Leach, DO  I observed and examined the patient with the resident and agree with assessment and plan.  Note reviewed and modified by me. Alexander Big, MD

## 2023-02-18 NOTE — Assessment & Plan Note (Signed)
He has had significant improvement with colchicine 0.6 mg twice daily.  Will send a refill for him so that he may continue this regimen for a full 3 months.  Recommend continued dietary foods low in purines.  Follow-up in 2 months, if he has had resolution of his symptoms completely would recommend reducing colchicine to once a day.

## 2023-03-11 ENCOUNTER — Encounter: Payer: Self-pay | Admitting: *Deleted

## 2023-03-11 ENCOUNTER — Other Ambulatory Visit: Payer: Self-pay | Admitting: *Deleted

## 2023-03-11 DIAGNOSIS — M10011 Idiopathic gout, right shoulder: Secondary | ICD-10-CM

## 2023-04-08 ENCOUNTER — Ambulatory Visit: Payer: Managed Care, Other (non HMO) | Admitting: Sports Medicine

## 2023-05-06 ENCOUNTER — Ambulatory Visit: Payer: Managed Care, Other (non HMO) | Admitting: Sports Medicine

## 2023-05-08 ENCOUNTER — Other Ambulatory Visit: Payer: Self-pay | Admitting: Sports Medicine

## 2023-06-03 ENCOUNTER — Other Ambulatory Visit: Payer: Self-pay | Admitting: Sports Medicine

## 2023-06-03 MED ORDER — TRAMADOL HCL 50 MG PO TABS: 50.0000 mg | ORAL_TABLET | Freq: Four times a day (QID) | ORAL | 3 refills | Status: DC | PRN

## 2023-06-12 ENCOUNTER — Ambulatory Visit: Payer: Managed Care, Other (non HMO) | Admitting: Sports Medicine

## 2023-06-12 VITALS — BP 130/94 | Ht 73.0 in | Wt 200.0 lb

## 2023-06-12 DIAGNOSIS — M67911 Unspecified disorder of synovium and tendon, right shoulder: Secondary | ICD-10-CM | POA: Diagnosis not present

## 2023-06-12 DIAGNOSIS — M1A09X0 Idiopathic chronic gout, multiple sites, without tophus (tophi): Secondary | ICD-10-CM

## 2023-06-12 MED ORDER — ALLOPURINOL 300 MG PO TABS: 300.0000 mg | ORAL_TABLET | Freq: Every day | ORAL | 5 refills | Status: DC

## 2023-06-12 MED ORDER — NITROGLYCERIN 0.2 MG/HR TD PT24: 0.2000 mg | MEDICATED_PATCH | Freq: Every day | TRANSDERMAL | 5 refills | Status: DC

## 2023-06-12 MED ORDER — COLCHICINE 0.6 MG PO TABS: 0.6000 mg | ORAL_TABLET | Freq: Two times a day (BID) | ORAL | 3 refills | Status: DC

## 2023-06-12 NOTE — Progress Notes (Addendum)
PCP: Patient, No Pcp Per  SUBJECTIVE:   HPI:  Patient is a 47 y.o. male here with chief complaint of chronic right shoulder pain.   He was seen here at [facility] both in March and April of this year and found to have right [facility] joint gout inflammation as well as crystal deposit in right supraspinatus on U/S. He reports he took the Colchicine BID as Rx'd though transitioned off of this to Allopurinol 300mg  every day through his PCP.  Colchicine clearly helped his pain while using.  He states his pain now is more in the anterolateral aspect of the right shoulder -- AC joint pain resolved. He denies any injury. 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 He has resistance bands and has done multiple months of home exercises for his right RTC without noticeable improvement in his pain. Notes + night pain and inability to sleep on affected side.  ROS:     See HPI  PERTINENT  PMH / PSH FH / / SH:  Past Medical, Surgical, Social, and Family History Reviewed & Updated in the EMR.  Pertinent findings include:  Gout  No Known Allergies   OBJECTIVE:  BP (!) 130/94   Ht 6\' 1"  (1.854 m)   Wt 200 lb (90.7 kg)   BMI 26.39 kg/m   PHYSICAL EXAM:  GEN: Alert and Oriented, NAD, comfortable in exam room RESP: Unlabored respirations, symmetric chest rise PSY: normal mood, congruent affect   Right Shoulder MSK EXAM: No swelling, ecchymoses. No gross deformity. TTP in right anterior shoulder and bicipital groove. Non-tender AC joint. FROM. Crepitus over [facility] joint with cross-arm testing. Negative Hawkins, Neers. Positive Speeds, Negative Yergasons. Strength 5/5 with empty can and resisted internal/external rotation though reproducible pain with these. Negative apprehension. Negative cross arm and chuck norris testing of AC joint. NV intact distally.   ASSESSMENT & PLAN:  1. Tendinopathy  of rotator cuff, right 2. 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 Had initial improvement while on Colchicine though exacerbation of pain since discontinuation. He has failed traditional HEP and we discussed augmenting this with nitroglycerin protocol as this is a chronic tendinopathy which he is amenable to. Rx sent. Reviewed appropriate use and side effects to monitor for. In addition, plan to resume RX for Colchicine 0.6mg  BID for now and decrease to just once daily once notable improvement is made. Continue Allopurinol 300mg  every day and new Rx provided with 6 months of refills. Advised reaching out to prior PCP to get record of most recent Uric Acid level and bring with him at subsequent visits. Given failure to improve with multiple months of home exercise program - I do think we need to proceed with advanced imaging in the form of both shoulder xrays and noncontrast MRI to better evaluate the shoulder joint for potential gouty crystal involvement. Follow-up in 4-6 weeks to review these results and monitor response to NG protocol w/ HEP.   Glean Salen, MD PGY-4, Sports Medicine Fellow [facility]  I observed and examined the patient with the resident and agree with assessment and plan.  Note reviewed and modified by me. Sterling Big, MD  Phone 07/01/23 Left VM.  Patients films show only AC joint arthritis.  Will discuss but wonder if  this is source of most pain.  KBF

## 2023-06-12 NOTE — Patient Instructions (Signed)
The gout in your right shoulder appears to be affecting both the [facility] joint and the rotator cuff. We will refill the gout medications. Colchicine to take twice daily for the next month then once daily after your follow-up if you are feeling better. Allopurinol to take once daily. In addition to this start to incorporate the the nitroglycerin protocol below to the shoulder rehabilitation exercises you have been doing. We have ordered you a shoulder xray and MRI to review at your follow-up appointment. F/u in 4-6 weeks.  Nitroglycerin Protocol Apply 1/4 nitroglycerin patch to affected area daily. Change position of patch within the affected area every 24 hours. You may experience a headache during the first 1-2 weeks of using the patch, these should subside. If you experience headaches after beginning nitroglycerin patch treatment, you may take your preferred over the counter pain reliever. Another side effect of the nitroglycerin patch is skin irritation or rash related to patch adhesive. Please notify our office if you develop more severe headaches or rash, and stop the patch. Tendon healing with nitroglycerin patch may require 12 to 24 weeks depending on the extent of injury. Men should not use if taking Viagra, Cialis, or Levitra.  Do not use if you have migraines or rosacea.

## 2023-06-12 NOTE — Assessment & Plan Note (Signed)
Chronic tendinopathy of right RTC with known gouty crystal deposits noted on U/S in march and April earlier this year. Had initial improvement while on Colchicine though exacerbation of pain since discontinuation. He has failed traditional HEP and we discussed augmenting this with nitroglycerin protocol as this is a chronic tendinopathy which he is amenable to. Rx sent. Reviewed appropriate use and side effects to monitor for. In addition, plan to resume RX for Colchicine 0.6mg  BID for now and decrease to just once daily once notable improvement is made. Continue Allopurinol 300mg  every day and new Rx provided with 6 months of refills. Advised reaching out to prior PCP to get record of most recent Uric Acid level and bring with him at subsequent visits. Given failure to improve with multiple months of home exercise program - I do think we need to proceed with advanced imaging in the form of both shoulder xrays and noncontrast MRI to better evaluate the shoulder joint for potential gouty crystal involvement. Follow-up in 4-6 weeks to review these results and monitor response to NG protocol w/ HEP.

## 2023-06-24 ENCOUNTER — Ambulatory Visit
Admission: RE | Admit: 2023-06-24 | Discharge: 2023-06-24 | Disposition: A | Discharge status: Home / Self Care | Payer: Managed Care, Other (non HMO) | Source: Ambulatory Visit | Attending: Sports Medicine

## 2023-06-24 DIAGNOSIS — M67911 Unspecified disorder of synovium and tendon, right shoulder: Secondary | ICD-10-CM

## 2023-06-28 ENCOUNTER — Encounter: Payer: Self-pay | Admitting: Sports Medicine

## 2023-07-23 ENCOUNTER — Encounter: Payer: Self-pay | Admitting: Sports Medicine

## 2023-07-23 ENCOUNTER — Ambulatory Visit: Payer: Managed Care, Other (non HMO) | Admitting: Sports Medicine

## 2023-07-23 ENCOUNTER — Other Ambulatory Visit: Payer: Self-pay

## 2023-07-23 VITALS — BP 120/82 | Ht 73.0 in | Wt 200.0 lb

## 2023-07-23 DIAGNOSIS — M67911 Unspecified disorder of synovium and tendon, right shoulder: Secondary | ICD-10-CM | POA: Diagnosis not present

## 2023-07-23 MED ORDER — COLCHICINE 0.6 MG PO TABS: 0.6000 mg | ORAL_TABLET | Freq: Two times a day (BID) | ORAL | 3 refills | Status: AC

## 2023-07-23 MED ORDER — ALLOPURINOL 300 MG PO TABS: 300.0000 mg | ORAL_TABLET | Freq: Every day | ORAL | 5 refills | Status: DC

## 2023-07-23 MED ORDER — TRAMADOL HCL 50 MG PO TABS: 50.0000 mg | ORAL_TABLET | Freq: Four times a day (QID) | ORAL | 3 refills | Status: DC | PRN

## 2023-07-23 NOTE — Progress Notes (Signed)
PCP: Patient, No Pcp Per  Subjective:   HPI: Patient is a 47 y.o. male here for follow up of his right shoulder pain.  Patient states that he feels like the pain in his [facility] joint has gone away and he has no issues with that but now he feels like he does have pain in the posterior aspect of his shoulder.  Patient states that he has pain whenever he is doing incline bench press or abducting his arm against any sort of resistance.  Patient states that the pain has been relatively significant and does keep him up at night sometimes.  Patient cannot remember any inciting event but does note that he remembers some pain after doing [employer] as an exercise relatively recently.Marland Kitchen   History reviewed. No pertinent past medical history.  Current Outpatient Medications on File Prior to Visit  Medication Sig Dispense Refill   traMADol (ULTRAM) 50 MG tablet Take 1 tablet (50 mg total) by mouth every 6 (six) hours as needed. for pain 90 tablet 3   No current facility-administered medications on file prior to visit.    History reviewed. No pertinent surgical history.  No Known Allergies  BP 120/82   Ht 6\' 1"  (1.854 m)   Wt 200 lb (90.7 kg)   BMI 26.39 kg/m       No data to display              No data to display              Objective:  Physical Exam:  Gen: NAD, comfortable in exam room  Shoulder, Right: No  skin changes, erythema, or ecchymosis noted. No evidence of bony deformity, asymmetry, or muscle atrophy; No tenderness over long head of biceps (bicipital groove). No TTP at [facility] joint.  Range of motion is intact with abduction, external and internal rotation.  Strength is 4 out of 5 against resistance with abduction, 5 out of 5 with internal and external rotation.    Special Tests:   - Painful Arc present at >90 degrees   - Empty can: NEG   - Int/Ext Rotation test: NEG   - Gerber Lift-Off Test: NEG   - Crossarm Adduction test: NEG   - Hawkins: NEG   - Neer test: NEG   -  O'brien's test: NEG   - Yergason's: NEG   - Speeds test: NEG  U/S Findings:  Supraspinatus Tendon: The supraspinatus tendon is well-defined and appears normal. The tendon is of normal thickness without any hypoechoic areas or tears. The bursal surface is smooth with no evidence of fluid accumulation.  Infraspinatus Tendon: The infraspinatus tendon appears intact at its insertion. There is a large hyperechoic region consistent with scar formation near the body of the infraspinatus.   Teres Minor Tendon: The teres minor tendon is normal in appearance with no abnormalities. The fibers are well-aligned and demonstrate normal echogenicity.   Subacromial-Subdeltoid Bursa: The bursa is normal, with no thickening or fluid accumulation.  [facility] joint shows degenerative change as well as multiple calcified spurs and sediments noted.  If there is some mild hypoechoic changes likely consistent with some fluid.  Impression:  AC joint with degenerative changes, multiple calcified spurs and sentiments.  Infraspinatus with a large hypoechoic region consistent with a scar formation at its body.  Rest of rotator cuff muscles intact without any damage   Ultrasound and interpretation by Brenton Grills MD and  Sibyl Parr. Fields, MD  Assessment & Plan:  1.  1. Tendinopathy of rotator cuff, right -Patient is ultrasound and physical exam is consistent with a rotator cuff pathology.  Patient's ultrasound does show significant scar formation of the infraspinatus which would be consistent with his symptoms. - [place] LIMITED JOINT SPACE STRUCTURES UP RIGHT; Future   Brenton Grills MD, PGY-4  Sports Medicine Fellow [facility]  I observed and examined the patient with the [facility] resident and agree with assessment and plan.  Note reviewed and modified by me.   Sterling Big, MD  Procedure: ECSWT Indications:  Infraspinatus scar tissue   Procedure Details Consent: Risks of procedure as well as the  alternatives and risks of each were explained to the patient.  Verbal consent for procedure obtained. Time Out: Verified patient identification, verified procedure, site was marked, verified correct patient position. The area was cleaned with alcohol swab.     The infraspinatus was targeted for Extracorporeal shockwave therapy.    Preset: Rotator cuff tendon  Power Level: 120 Frequency: 12 Impulse/cycles: 2500 Head size: Large   Patient tolerated procedure well without immediate complications.   Would recommend 4-5 more sessions depending on improvement

## 2023-07-30 ENCOUNTER — Ambulatory Visit: Payer: Self-pay | Admitting: Family Medicine

## 2023-07-30 ENCOUNTER — Ambulatory Visit (INDEPENDENT_AMBULATORY_CARE_PROVIDER_SITE_OTHER): Payer: Self-pay | Admitting: Family Medicine

## 2023-07-30 ENCOUNTER — Encounter: Payer: Self-pay | Admitting: Family Medicine

## 2023-07-30 DIAGNOSIS — M67911 Unspecified disorder of synovium and tendon, right shoulder: Secondary | ICD-10-CM

## 2023-07-30 NOTE — Assessment & Plan Note (Addendum)
Procedure: ECSWT Indications:  R rotator cuff tendinopathy   Procedure Details Consent: Risks of procedure as well as the alternatives and risks of each were explained to the patient.  Written consent for procedure obtained. Time Out: Verified patient identification, verified procedure, site was marked, verified correct patient position, medications/allergies/relevent history reviewed.  The area was cleaned with alcohol swab.     The R supraspinatous was targeted for Extracorporeal shockwave therapy.    Preset:  Power Level: 130 MJ Frequency: 14 Hz Impulse/cycles: 3000 Head size: large   Patient tolerated procedure well without immediate complications.  Exercises given for rotator cuff strengthening.  We discussed modification of some of the exercises and topical therapy.   Will follow-up next week for repeat therapy.  Hopefully this will give him some relief.

## 2023-07-30 NOTE — Progress Notes (Signed)
  HUDSYN CHAMPINE - 47 y.o. male MRN [id number]  Date of birth: [birth year]  PCP: Patient, No Pcp Per  Subjective:  No chief complaint on file. Right shoulder pain  HPI: Past Medical, Surgical, Social, and Family History Reviewed & Updated per EMR.   Patient is a 47 y.o. male here for follow up on right shoulder pain.  He responded well to his last shockwave treatment last week.  However, he did notice some recurrent pain after doing push-ups for multiple days.  No past medical history on file.  Current Outpatient Medications on File Prior to Visit  Medication Sig Dispense Refill   allopurinol (ZYLOPRIM) 300 MG tablet Take 1 tablet (300 mg total) by mouth daily. 34 tablet 5   colchicine 0.6 MG tablet Take 1 tablet (0.6 mg total) by mouth 2 (two) times daily. 60 tablet 3   traMADol (ULTRAM) 50 MG tablet Take 1 tablet (50 mg total) by mouth every 6 (six) hours as needed. for pain 90 tablet 3   No current facility-administered medications on file prior to visit.    No past surgical history on file.  No Known Allergies      Objective:  Physical Exam: VS: BP:   HR: bpm  TEMP: ( )  RESP:   HT:    WT:   BMI:   Gen: NAD, speaks clearly, comfortable in exam room Respiratory: Normal respiratory effort on room air. No signs of distress Skin: No rashes, abrasions, or ecchymosis MSK: Full range of motion of the right shoulder Mild tenderness palpation over the posterior aspect inferior to the acromion Strength 5/5 No sensory deficits    Assessment & Plan:   Tendinopathy of rotator cuff, right Procedure: ECSWT Indications:  R rotator cuff tendinopathy   Procedure Details Consent: Risks of procedure as well as the alternatives and risks of each were explained to the patient.  Written consent for procedure obtained. Time Out: Verified patient identification, verified procedure, site was marked, verified correct patient position, medications/allergies/relevent history  reviewed.  The area was cleaned with alcohol swab.     The R supraspinatous was targeted for Extracorporeal shockwave therapy.    Preset:  Power Level: 130 MJ Frequency: 14 Hz Impulse/cycles: 3000 Head size: large   Patient tolerated procedure well without immediate complications.  Exercises given for rotator cuff strengthening.  We discussed modification of some of the exercises and topical therapy.   Will follow-up next week for repeat therapy.  Hopefully this will give him some relief.     Rica Mote MD [facility] Fellow   Addendum:  Patient seen in the office by fellow.  History, exam, plan of care were precepted with me.  Darene Lamer, DO, CAQSM

## 2023-08-08 ENCOUNTER — Ambulatory Visit: Payer: Self-pay | Admitting: Family Medicine

## 2023-11-04 ENCOUNTER — Other Ambulatory Visit: Payer: Self-pay | Admitting: Sports Medicine

## 2023-11-04 MED ORDER — TRAMADOL HCL 50 MG PO TABS: 50.0000 mg | ORAL_TABLET | Freq: Four times a day (QID) | ORAL | 3 refills | Status: DC | PRN

## 2024-03-05 ENCOUNTER — Other Ambulatory Visit: Payer: Self-pay | Admitting: Sports Medicine

## 2024-03-08 ENCOUNTER — Other Ambulatory Visit: Payer: Self-pay | Admitting: Family Medicine

## 2024-03-08 MED ORDER — TRAMADOL HCL 50 MG PO TABS: 50.0000 mg | ORAL_TABLET | Freq: Four times a day (QID) | ORAL | 0 refills | Status: DC | PRN

## 2024-03-08 NOTE — Progress Notes (Signed)
 Refilling for Dr. Nolene Baumgarten who is out of the office.  Reviewed database.

## 2024-04-13 ENCOUNTER — Other Ambulatory Visit: Payer: Self-pay | Admitting: Sports Medicine

## 2024-04-13 MED ORDER — ALLOPURINOL 300 MG PO TABS: 300.0000 mg | ORAL_TABLET | Freq: Every day | ORAL | 5 refills | Status: AC

## 2024-04-13 MED ORDER — TRAMADOL HCL 50 MG PO TABS: 50.0000 mg | ORAL_TABLET | Freq: Four times a day (QID) | ORAL | 3 refills | Status: DC | PRN

## 2024-05-04 ENCOUNTER — Other Ambulatory Visit: Payer: Self-pay | Admitting: Sports Medicine

## 2024-05-04 MED ORDER — TRAMADOL HCL 50 MG PO TABS: 50.0000 mg | ORAL_TABLET | Freq: Four times a day (QID) | ORAL | 3 refills | Status: DC | PRN

## 2024-10-15 ENCOUNTER — Telehealth: Payer: Self-pay

## 2024-10-15 DIAGNOSIS — M5106 Intervertebral disc disorders with myelopathy, lumbar region: Secondary | ICD-10-CM

## 2024-10-15 MED ORDER — TRAMADOL HCL 50 MG PO TABS: 50.0000 mg | ORAL_TABLET | Freq: Four times a day (QID) | ORAL | 0 refills | Status: DC | PRN

## 2024-10-25 ENCOUNTER — Telehealth: Payer: Self-pay

## 2024-10-25 NOTE — Telephone Encounter (Signed)
 Patient would like to know if he needs to be seen due to him getting refill on his traMADol  (ULTRAM  on 10/15/24, please advise, thanks.

## 2024-11-02 ENCOUNTER — Other Ambulatory Visit: Payer: Self-pay | Admitting: Sports Medicine

## 2024-11-02 MED ORDER — TRAMADOL HCL 50 MG PO TABS: 50.0000 mg | ORAL_TABLET | Freq: Four times a day (QID) | ORAL | 3 refills | Status: AC | PRN

## 2024-12-21 ENCOUNTER — Telehealth: Admitting: Sports Medicine
